# Patient Record
Sex: Male | Born: 1975 | Hispanic: Yes | Marital: Single | Smoking: Never smoker
Health system: Southern US, Community
[De-identification: ages and names within clinical notes are randomized; demographics above are authoritative.]

---

## 2005-07-27 HISTORY — PX: APPENDECTOMY: SHX54

## 2014-04-07 ENCOUNTER — Emergency Department (INDEPENDENT_AMBULATORY_CARE_PROVIDER_SITE_OTHER)
Admission: EM | Admit: 2014-04-07 | Discharge: 2014-04-07 | Disposition: A | Discharge status: Nursing Home (Includes ICF) | Payer: Self-pay | Source: Home / Self Care | Attending: Family Medicine | Admitting: Family Medicine

## 2014-04-07 ENCOUNTER — Emergency Department (HOSPITAL_COMMUNITY)
Admission: EM | Admit: 2014-04-07 | Discharge: 2014-04-07 | Disposition: A | Discharge status: Home / Self Care | Payer: Self-pay | Attending: Emergency Medicine | Admitting: Emergency Medicine

## 2014-04-07 ENCOUNTER — Encounter (HOSPITAL_COMMUNITY): Payer: Self-pay | Admitting: Emergency Medicine

## 2014-04-07 ENCOUNTER — Emergency Department (HOSPITAL_COMMUNITY): Payer: Self-pay

## 2014-04-07 ENCOUNTER — Other Ambulatory Visit (HOSPITAL_COMMUNITY)
Admission: RE | Admit: 2014-04-07 | Discharge: 2014-04-07 | Disposition: A | Discharge status: Home / Self Care | Payer: Self-pay | Source: Ambulatory Visit | Attending: Family Medicine | Admitting: Family Medicine

## 2014-04-07 DIAGNOSIS — N50812 Left testicular pain: Secondary | ICD-10-CM

## 2014-04-07 DIAGNOSIS — A539 Syphilis, unspecified: Secondary | ICD-10-CM | POA: Insufficient documentation

## 2014-04-07 DIAGNOSIS — N50819 Testicular pain, unspecified: Secondary | ICD-10-CM

## 2014-04-07 DIAGNOSIS — Z113 Encounter for screening for infections with a predominantly sexual mode of transmission: Secondary | ICD-10-CM | POA: Insufficient documentation

## 2014-04-07 DIAGNOSIS — R300 Dysuria: Secondary | ICD-10-CM | POA: Insufficient documentation

## 2014-04-07 DIAGNOSIS — N509 Disorder of male genital organs, unspecified: Secondary | ICD-10-CM

## 2014-04-07 LAB — POCT URINALYSIS DIP (DEVICE)
Bilirubin Urine: NEGATIVE
Glucose, UA: NEGATIVE mg/dL
Hgb urine dipstick: NEGATIVE
KETONES UR: NEGATIVE mg/dL
LEUKOCYTES UA: NEGATIVE
NITRITE: NEGATIVE
PROTEIN: NEGATIVE mg/dL
Specific Gravity, Urine: 1.01 (ref 1.005–1.030)
Urobilinogen, UA: 0.2 mg/dL (ref 0.0–1.0)
pH: 7 (ref 5.0–8.0)

## 2014-04-07 LAB — HIV ANTIBODY (ROUTINE TESTING W REFLEX): HIV 1&2 Ab, 4th Generation: NONREACTIVE

## 2014-04-07 LAB — RPR

## 2014-04-07 MED ORDER — LIDOCAINE HCL (PF) 1 % IJ SOLN
5.0000 mL | Freq: Once | INTRAMUSCULAR | Status: AC
  Administered 2014-04-07: 1.8 mL

## 2014-04-07 MED ORDER — OXYCODONE-ACETAMINOPHEN 5-325 MG PO TABS: 1.0000 | ORAL_TABLET | ORAL | Status: AC | PRN

## 2014-04-07 MED ORDER — AZITHROMYCIN 250 MG PO TABS
1000.0000 mg | ORAL_TABLET | Freq: Every day | ORAL | Status: DC
  Administered 2014-04-07: 1000 mg via ORAL
  Filled 2014-04-07: qty 4

## 2014-04-07 MED ORDER — IBUPROFEN 800 MG PO TABS: 800.0000 mg | ORAL_TABLET | Freq: Three times a day (TID) | ORAL | Status: AC

## 2014-04-07 MED ORDER — IBUPROFEN 800 MG PO TABS
800.0000 mg | ORAL_TABLET | Freq: Once | ORAL | Status: AC
  Administered 2014-04-07: 800 mg via ORAL
  Filled 2014-04-07: qty 1

## 2014-04-07 MED ORDER — LIDOCAINE HCL (PF) 1 % IJ SOLN
INTRAMUSCULAR | Status: AC
  Administered 2014-04-07: 1.8 mL
  Filled 2014-04-07: qty 5

## 2014-04-07 MED ORDER — PENICILLIN G BENZATHINE 1200000 UNIT/2ML IM SUSP
1.2000 10*6.[IU] | Freq: Once | INTRAMUSCULAR | Status: AC
  Administered 2014-04-07: 1.2 10*6.[IU] via INTRAMUSCULAR
  Filled 2014-04-07: qty 2

## 2014-04-07 MED ORDER — CEFTRIAXONE SODIUM 250 MG IJ SOLR
250.0000 mg | Freq: Once | INTRAMUSCULAR | Status: AC
  Administered 2014-04-07: 250 mg via INTRAMUSCULAR
  Filled 2014-04-07: qty 250

## 2014-04-07 MED ORDER — AZITHROMYCIN 1 G PO PACK: 1.0000 g | PACK | Freq: Once | ORAL | Status: DC

## 2014-04-07 MED ORDER — OXYCODONE-ACETAMINOPHEN 5-325 MG PO TABS
2.0000 | ORAL_TABLET | Freq: Once | ORAL | Status: AC
  Administered 2014-04-07: 2 via ORAL
  Filled 2014-04-07: qty 2

## 2014-04-07 NOTE — ED Notes (Signed)
C/o L testicle pain the day after having relations with a lady and condom broke. Also had accident at work yesterday. Small amount burning with urination.  Pain keeps him from sleeping at night.  Pain goes to L kidney.  No discharge from penis.

## 2014-04-07 NOTE — Discharge Instructions (Signed)
Sfilis (Syphilis) La sfilis es una enfermedad infecciosa. Puede causar complicaciones graves si no se trata.  CAUSAS  La causa de la sfilis es un tipo de bacteria denominada Treponema pallidum. Por lo general se contagia a travs del contacto sexual. La sfilis tambin puede contagiarse al feto a travs de la sangre de la [place].  Blake Divine SNTOMAS Los sntomas pueden variar segn la etapa de la enfermedad. Algunos sntomas pueden desaparecer sin tratamiento. Sin embargo, esto no significa que la infeccin haya desaparecido. Un tipo de sifilis (llamado sfilis latente) no presenta sntomas.  Sfilis primaria  Llagas indoloras (chancros) dentro y alrededor de los rganos genitales y la boca.  Ganglios linfticos hinchados alrededor [employer]. Sfilis secundaria  Se presenta como una erupcin o llagas en cualquier zona del cuerpo, incluidas las palmas de las manos y las plantas de los pies.  Grant Ruts.  Dolor de [place].  Dolor de Advertising copywriter.  Ganglios linfticos hinchados.  Nuevas llagas en la boca o en los genitales.  Sensacin de Risk analyst en las articulaciones. Sfilis terciaria La tercera etapa de la sfilis implica daos graves a diferentes rganos del cuerpo, como el cerebro, la mdula espinal y Insurance underwriter. Los signos y los sntomas pueden ser:   Demencia.  Cambios en el estado de nimo y la personalidad.  Dificultad para caminar.  Insuficiencia cardaca.  Desmayos.  Agrandamiento de la aorta (aneurisma).  Tumores en la piel, huesos o hgado.  Debilidad muscular.  Dolores repentinos, entumecimiento u hormigueo.  Dificultad en la coordinacin.  Cambios en la visin. DIAGNSTICO   Se le practicar un examen fsico.  Le realizarn anlisis de sangre para confirmar el diagnstico.  Si la enfermedad se encuentra en la primera o la segunda etapa, podrn extraer Lauris Poag del fluido (drenaje) proveniente de una llaga o de la erupcin para  examinarla con el microscopio y Engineer, manufacturing la bacteria que causa la enfermedad.  El lquido que rodea la mdula espinal quizs deba examinarse para detectar si hay una lesin cerebral o inflamacin de las membranas que cubren el cerebro (meningitis).  Si la enfermedad se encuentra en la tercera etapa, se realizarn radiografas, tomografa computada, ecocardiogramas, ecografas o cateterismo cardaco para detectar enfermedades del corazn, aorta o cerebro. TRATAMIENTO  La sfilis puede curarse con antibiticos si se realiza un diagnstico temprano. Durante Film/video editor de [place], podr tener fiebre, escalofros, dolor de cabeza, nuseas o dolor en todo el cuerpo. Es Neomia Dear reaccin normal a los antibiticos.  INSTRUCCIONES PARA EL CUIDADO EN EL HOGAR   Tome los antibiticos como le indic el mdico. Termine los antibiticos aunque comience a sentirse mejor. Un tratamiento incompleto lo pondr en riesgo de continuar con la infeccin y en peligro de [place].  Tome los medicamentos solamente como se lo haya indicado el mdico.  No tenga relaciones sexuales hasta completar el tratamiento, o segn las indicaciones del mdico.  Informe a sus ltimas parejas sexuales que le han diagnosticado sfilis. Ellos deben solicitar atencin y tratamiento, aun si no tienen sntomas. Es necesario que todos sus compaeros sexuales sean examinados para diagnosticar y tratar la infeccin dado Restaurant manager, fast food.  Concurra a todas las visitas de control como se lo haya indicado el mdico. Es importante que asista a todas las visitas.  Si los [employer] estudios no estn listos durante la visita, tenga otra entrevista con su mdico para conocerlos. No suponga que los resultados son normales si no tiene noticias del mdico o del centro mdico.  Es su responsabilidad retirar el resultado del estudio. SOLICITE ATENCIN MDICA SI:  [place] 24 horas de comenzado el tratamiento, [place]  teniendo:  Grant Ruts.  Escalofros.  Dolor de [place].  Nuseas.  Dolor en todo el cuerpo.  Sntomas de reaccin alrgica a los medicamentos como:  Escalofros.  Dolor de [place].  Sensacin de desvanecimiento.  Una nueva erupcin cutnea (en especial ronchas).  Dificultad para respirar. ASEGRESE DE QUE:   Comprende estas instrucciones.  Controlar su afeccin.  Recibir ayuda de inmediato si no mejora o si empeora. Document Released: 05/03/2013 Document Revised: 11/27/2013 [employer] Patient Information 2015 [place], [place]. This information is not intended to replace advice given to you by your health care provider. Make sure you discuss any questions you have with your health care provider.

## 2014-04-07 NOTE — ED Notes (Signed)
Pt complains of left testicle pain, denies back pain, denies penile drainage; interpretor utilized to speak with patient who speaks only spanish.

## 2014-04-07 NOTE — ED Provider Notes (Signed)
CSN: [id number]     Arrival date & time 04/07/14  1234 History   First MD Initiated Contact with Patient 04/07/14 1304     Chief Complaint  Patient presents with  . Testicle Pain   (Consider location/radiation/quality/duration/timing/severity/associated sxs/prior Treatment) HPI Comments: 38 year old Spanish-speaking male presents for evaluation of left testicle pain. History of present illness is limited by language barrier. Spanish interpreter was used. He has had the pain for either one day or for at least 4 days. He recently had sexual intercourse with a male and the condom broke, it is unclear if this happen before or after the testicle pain started. 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 No swelling, constipation. The pain is increased with bowel movements. The pain radiates up towards his abdomen and towards his left kidney  Patient is a 38 y.o. male presenting with testicular pain.  Testicle Pain Associated symptoms include abdominal pain.    History reviewed. No pertinent past medical history. Past Surgical History  Procedure Laterality Date  . Appendectomy  2007   Family History  Problem Relation Age of Onset  . Hypotension Mother    History  Substance Use Topics  . Smoking status: Never Smoker   . Smokeless tobacco: Not on file  . Alcohol Use: No    Review of Systems  Gastrointestinal: Positive for abdominal pain.  Genitourinary: Positive for testicular pain.  All other systems reviewed and are negative.   Allergies  Review of patient's allergies indicates no known allergies.  Home Medications   Prior to Admission medications   Medication Sig Start Date End Date Taking? Authorizing Provider  ibuprofen (ADVIL,MOTRIN) 200 MG tablet Take 800 mg by mouth every 6 (six) hours as needed.   Yes Historical Provider, MD   BP 127/77  Pulse 97  Temp(Src) 97.6 F (36.4 C) (Oral)  Resp 16   SpO2 99% Physical Exam  Nursing note and vitals reviewed. Constitutional: He is oriented to person, place, and time. He appears well-developed and well-nourished. No distress.  HENT:  Head: Normocephalic.  Pulmonary/Chest: Effort normal. No respiratory distress.  Genitourinary: Testes normal. Cremasteric reflex is present. Right testis shows no mass, no swelling and no tenderness. Left testis shows no mass, no swelling and no tenderness.  On the proximal shaft of the penis at the 12:00 position, there is a shallow nontender ulceration.  The testicles are nontender  The epididymis on the left is swollen and tender. Checking for both a direct and an indirect hernia on the left is very painful for the patient  Lymphadenopathy:       Right: No inguinal adenopathy present.       Left: Inguinal adenopathy present.  Neurological: He is alert and oriented to person, place, and time. Coordination normal.  Skin: Skin is warm and dry. No rash noted. He is not diaphoretic.  Psychiatric: He has a normal mood and affect. Judgment normal.    ED Course  Procedures (including critical care time) Labs Review Labs Reviewed  URINE CULTURE  RPR  HIV ANTIBODY (ROUTINE TESTING)  POCT URINALYSIS DIP (DEVICE)  URINE CYTOLOGY ANCILLARY ONLY    Imaging Review No results found.   MDM   1. Pain in left testicle    Checking HIV and RPR given probable syphilis given the appearance of the lesion on the penis.  For his main complaint, it is unclear whether he has epididymitis or  an incarcerated indirect inguinal hernia. Needs imaging to further delineate. Transferred to the ED via shuttle.   Graylon Good, PA-C 04/07/14 1413  Graylon Good, PA-C 04/07/14 1413

## 2014-04-07 NOTE — ED Provider Notes (Signed)
CSN: [id number]     Arrival date & time 04/07/14  1449 History   First MD Initiated Contact with Patient 04/07/14 1633     Chief Complaint  Patient presents with  . Testicle Pain     (Consider location/radiation/quality/duration/timing/severity/associated sxs/prior Treatment) HPI Comments: Condom broke while having sex with a woman 2 days ago. Has had L testicular pain since the incident with development of small ulcer on the top of his penis.  Patient is a 38 y.o. male presenting with testicular pain. The history is provided by the patient.  Testicle Pain This is a new problem. The current episode started more than 2 days ago. The problem occurs constantly. The problem has not changed since onset.Pertinent negatives include no chest pain, no abdominal pain and no shortness of breath. Nothing aggravates the symptoms.    History reviewed. No pertinent past medical history. Past Surgical History  Procedure Laterality Date  . Appendectomy  2007   Family History  Problem Relation Age of Onset  . Hypotension Mother    History  Substance Use Topics  . Smoking status: Never Smoker   . Smokeless tobacco: Not on file  . Alcohol Use: No    Review of Systems  Constitutional: Negative for fever.  Respiratory: Negative for cough and shortness of breath.   Cardiovascular: Negative for chest pain and leg swelling.  Gastrointestinal: Negative for vomiting and abdominal pain.  Genitourinary: Positive for dysuria (mild) and testicular pain.  All other systems reviewed and are negative.     Allergies  Review of patient's allergies indicates no known allergies.  Home Medications   Prior to Admission medications   Medication Sig Start Date End Date Taking? Authorizing Provider  ibuprofen (ADVIL,MOTRIN) 200 MG tablet Take 800 mg by mouth every 6 (six) hours as needed.    Historical Provider, MD   BP 131/78  Pulse 52  Temp(Src) 97.9 F (36.6 C) (Oral)  Resp 15  Ht 6' (1.829 m)  Wt  175 lb (79.379 kg)  BMI 23.73 kg/m2  SpO2 99% Physical Exam  Nursing note and vitals reviewed. Constitutional: He is oriented to person, place, and time. He appears well-developed and well-nourished. No distress.  HENT:  Head: Normocephalic and atraumatic.  Right Ear: External ear normal.  Mouth/Throat: Oropharynx is clear and moist. No oropharyngeal exudate.  Eyes: EOM are normal. Pupils are equal, round, and reactive to light.  Neck: Normal range of motion. Neck supple.  Cardiovascular: Normal rate and regular rhythm.  Exam reveals no friction rub.   No murmur heard. Pulmonary/Chest: Effort normal and breath sounds normal. No respiratory distress. He has no wheezes. He has no rales.  Abdominal: He exhibits no distension. There is no tenderness. There is no rebound. Hernia confirmed negative in the right inguinal area and confirmed negative in the left inguinal area.  Genitourinary:    Right testis shows no mass, no swelling and no tenderness. Left testis shows tenderness (in the epididymis, no testicular tenderness). Left testis shows no mass and no swelling.  Musculoskeletal: Normal range of motion. He exhibits no edema.  Lymphadenopathy:       Right: No inguinal adenopathy present.       Left: No inguinal adenopathy present.  Neurological: He is alert and oriented to person, place, and time.  Skin: He is not diaphoretic.    ED Course  Procedures (including critical care time) Labs Review Labs Reviewed - No data to display  Imaging Review US Scrotum  04/07/2014  CLINICAL DATA:  Left testicular pain.  EXAM: ULTRASOUND OF SCROTUM  TECHNIQUE: Complete ultrasound examination of the testicles, epididymis, and other scrotal structures was performed.  COMPARISON:  No priors.  FINDINGS: Right testicle  Measurements: 4.3 x 2.7 x 3.1 cm. No mass or microlithiasis visualized.  Left testicle  Measurements: 4.2 x 2.5 x 3.1 cm. No mass or microlithiasis visualized.  Right epididymis:  Normal  in size and appearance.  Left epididymis:  Normal in size and appearance.  Hydrocele:  None visualized.  Varicocele:  None visualized.  IMPRESSION: 1. No acute findings. Specifically, no evidence of epididymoorchitis, and no evidence of testicular torsion.   Electronically Signed   By: Trudie Reed M.D.   On: 04/07/2014 18:57     EKG Interpretation None      MDM   Final diagnoses:  Syphilis  Testicle pain    30F presents with L testicle pain. Sent from Urgent Care for an [place]. Patient was with a woman and the condom broke, he's had pain since that time. He also developed an ulcer on the top of his penis since then. Patient on exam has no testicular pain, but has L epididymal pain. No hernias appreciated, doubt incarcerated hernia. Will treat for syphilis, GC/Ch, and [place] his scrotum. [place] negative. Sent home with pain meds.     Elwin Mocha, MD 04/08/14 [phone]

## 2014-04-07 NOTE — ED Notes (Signed)
POD B pyxis out of penicillin injections. Notified pharmacy. Pharmacy sending one injection for this patient and then will potentially come restock pyxis at a later time.

## 2014-04-07 NOTE — ED Notes (Signed)
Pt sent from urgent care for further eval of left testicle pain x 2 days. Pt reports burning with urination. Pt denies discharge.

## 2014-04-07 NOTE — ED Notes (Signed)
Pt transported to ultrasound at this time 

## 2014-04-08 LAB — URINE CULTURE
COLONY COUNT: NO GROWTH
Culture: NO GROWTH

## 2014-04-08 NOTE — ED Provider Notes (Signed)
Medical screening examination/treatment/procedure(s) were performed by a resident physician or non-physician practitioner and as the supervising physician I was immediately available for consultation/collaboration.  Shelly Flatten, MD Family Medicine    Ozella Rocks, MD 04/08/14 [phone]

## 2015-09-03 IMAGING — US US SCROTUM
1 series · 14 of 25 positions shown · non-contrast
Comparison: No priors.

CLINICAL DATA: Left testicular pain.

EXAM:
ULTRASOUND OF SCROTUM
TECHNIQUE: Complete ultrasound examination of the testicles, epididymis, and
other scrotal structures was performed.

[Series 1: us scrotum · 0.07mm/px · 14 of 58 slices shown]
[im 1/58]
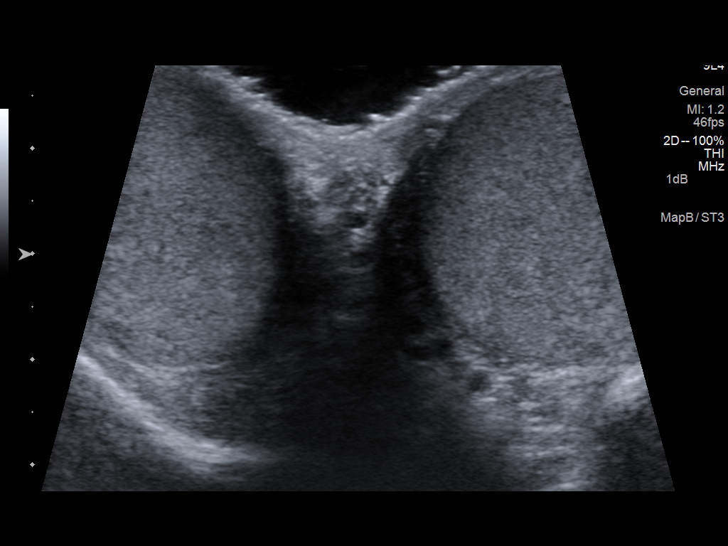
[im 5/58]
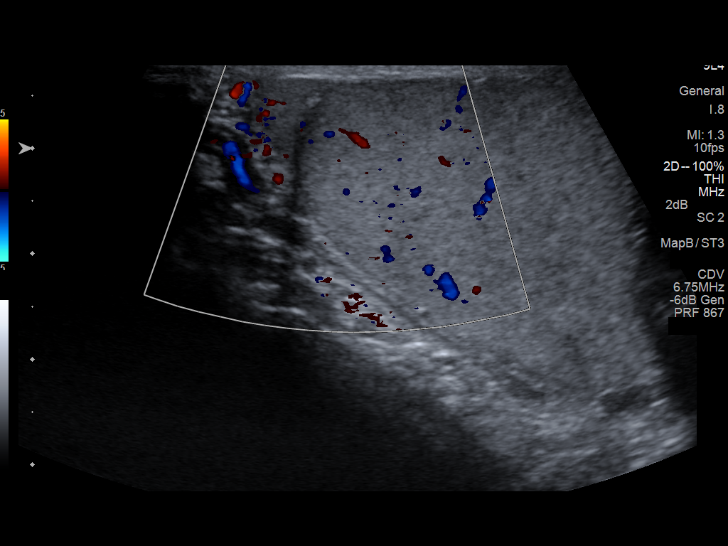
[im 10/58]
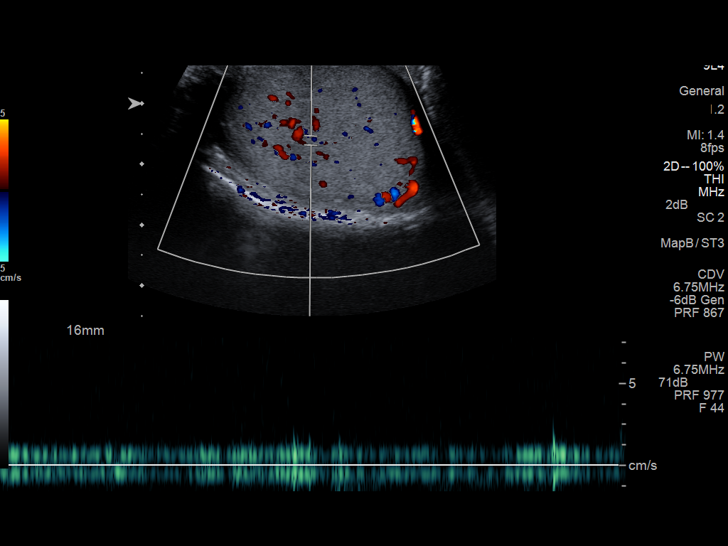
[im 15/58]
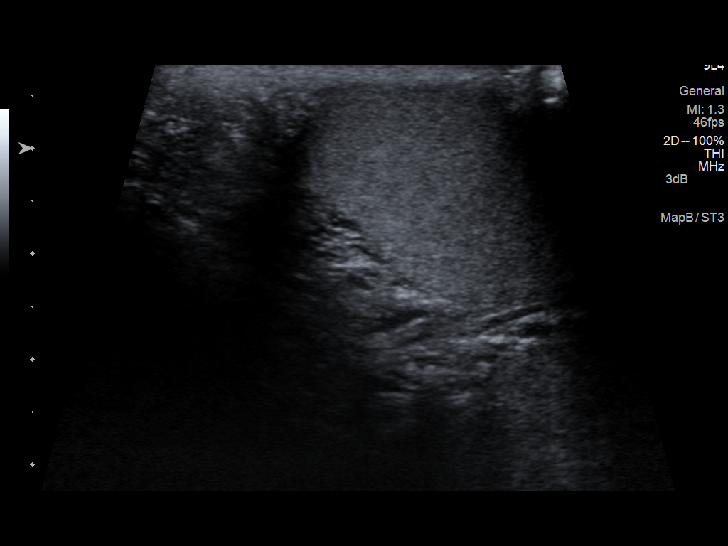
[im 20/58]
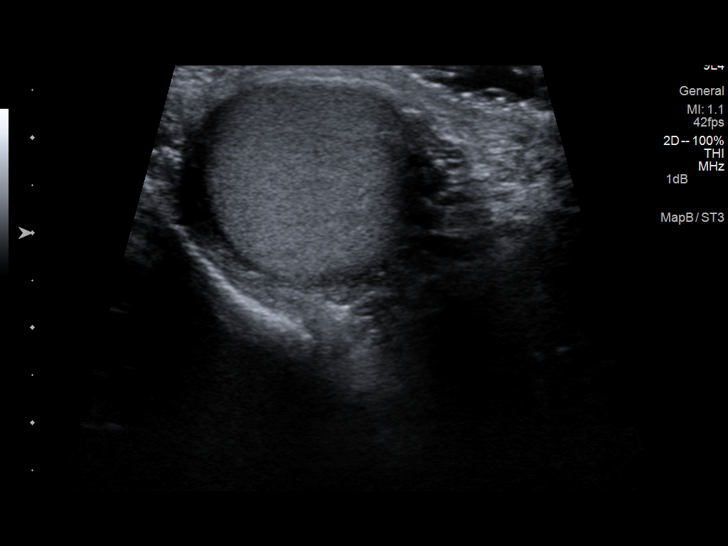
[im 22/58]
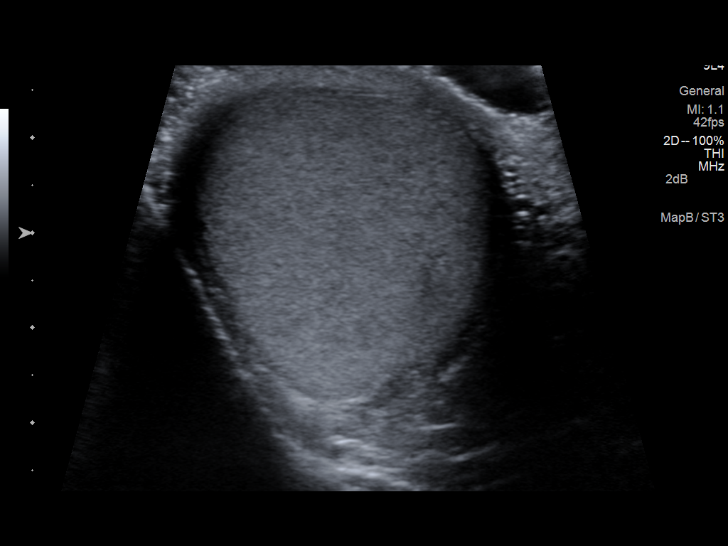
[im 27/58]
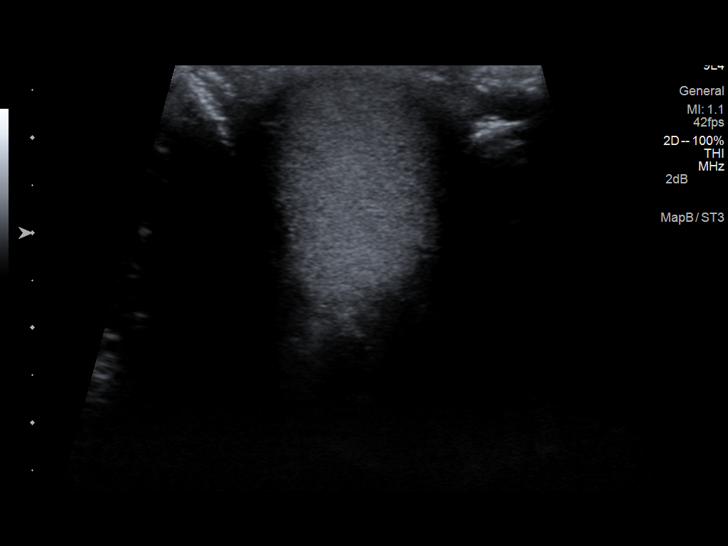
[im 31/58]
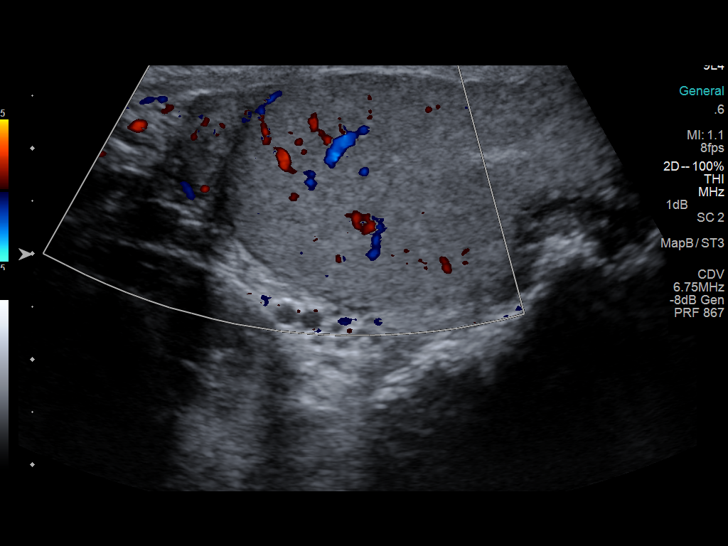
[im 36/58]
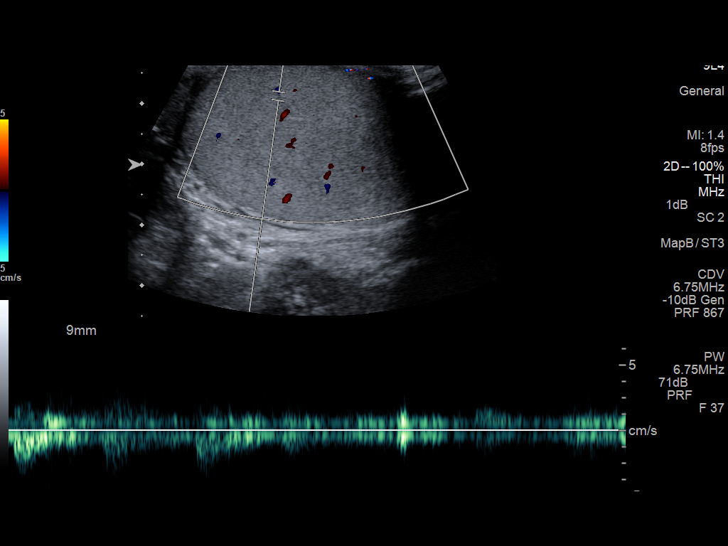
[im 39/58]
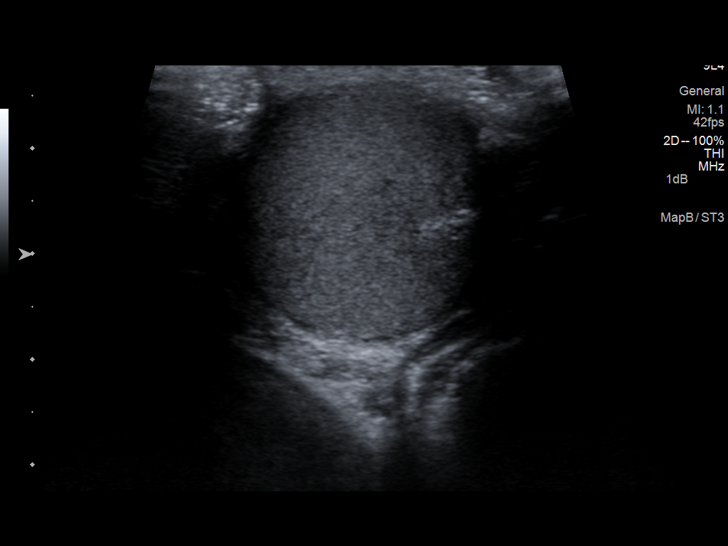
[im 43/58]
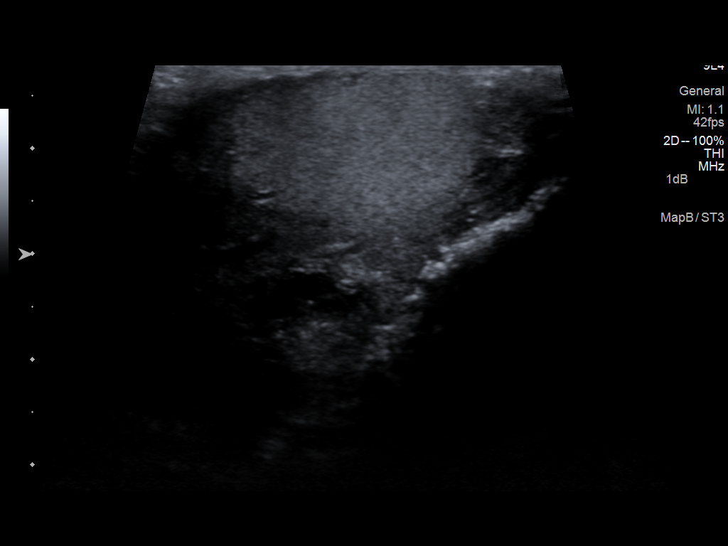
[im 48/58]
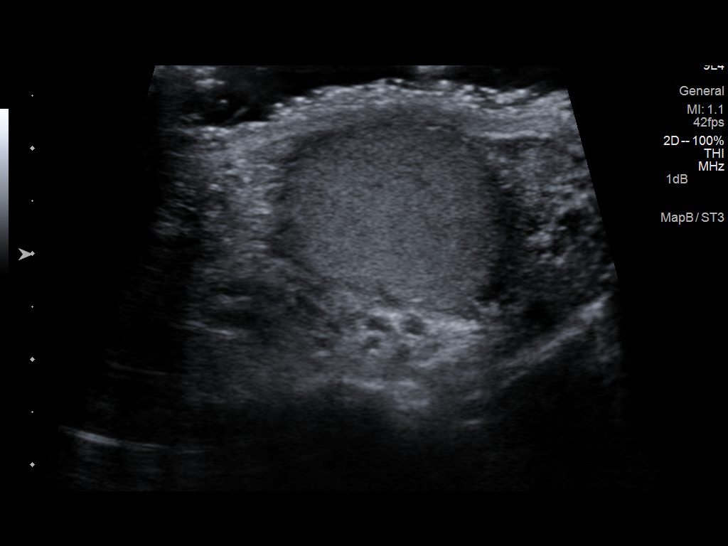
[im 53/58]
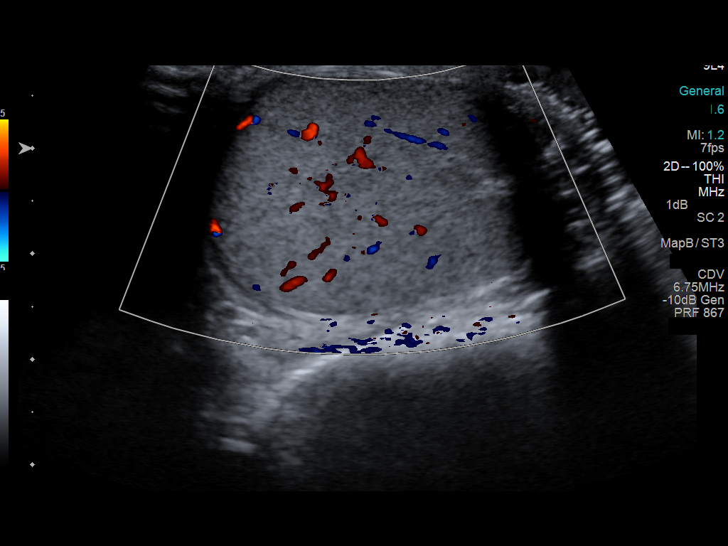
[im 58/58]
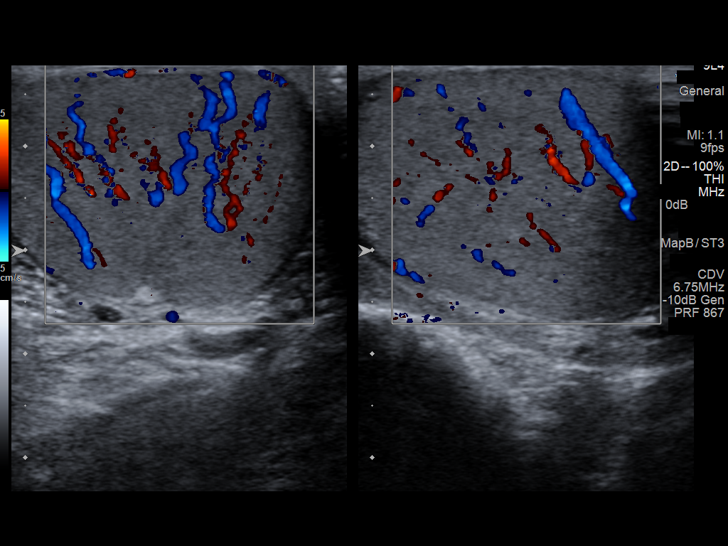

[14 of 25 positions shown; findings below may reference images not displayed]

FINDINGS: Right testicle

Measurements: 4.3 x 2.7 x 3.1 cm. No mass or microlithiasis
visualized.

Left testicle

Measurements: 4.2 x 2.5 x 3.1 cm. No mass or microlithiasis
visualized.

Right epididymis:  Normal in size and appearance.

Left epididymis:  Normal in size and appearance.

Hydrocele:  None visualized.

Varicocele:  None visualized.
IMPRESSION: 1. No acute findings. Specifically, no evidence of
epididymoorchitis, and no evidence of testicular torsion.
